# Patient Record
Sex: Female | Born: 1995 | Race: White | Marital: Single | Smoking: Never smoker
Health system: Southern US, Community
[De-identification: ages and names within clinical notes are randomized; demographics above are authoritative.]

## PROBLEM LIST (undated history)

## (undated) DIAGNOSIS — K529 Noninfective gastroenteritis and colitis, unspecified: Secondary | ICD-10-CM

## (undated) HISTORY — PX: COLONOSCOPY: SHX174

## (undated) HISTORY — PX: ESOPHAGOGASTRODUODENOSCOPY ENDOSCOPY: SHX5814

---
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
## 2017-03-08 IMAGING — CR DG CERVICAL SPINE COMPLETE 4+V
5 series · 5 of 5 positions shown · non-contrast
Comparison: None.

EXAM:
CERVICAL SPINE - COMPLETE 4+ VIEW

[c-spine lat]
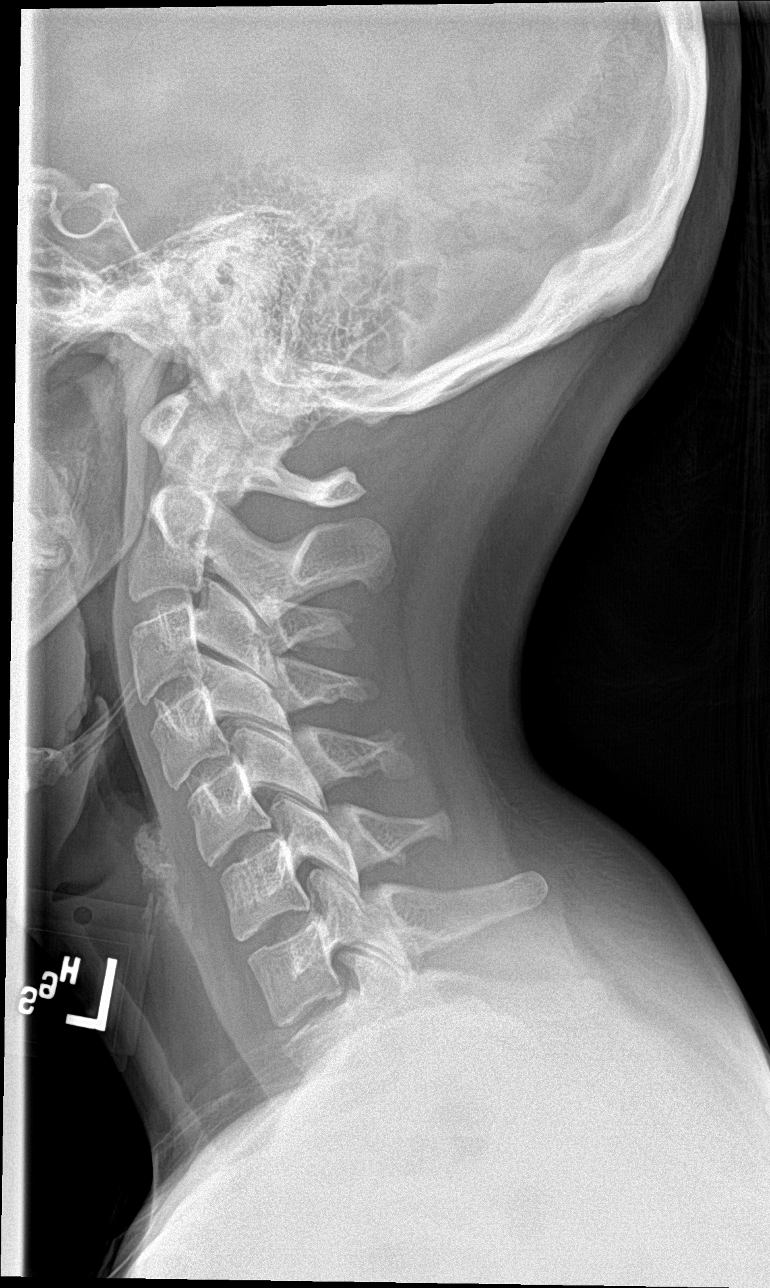

[c-spine obl (1 of 2)]
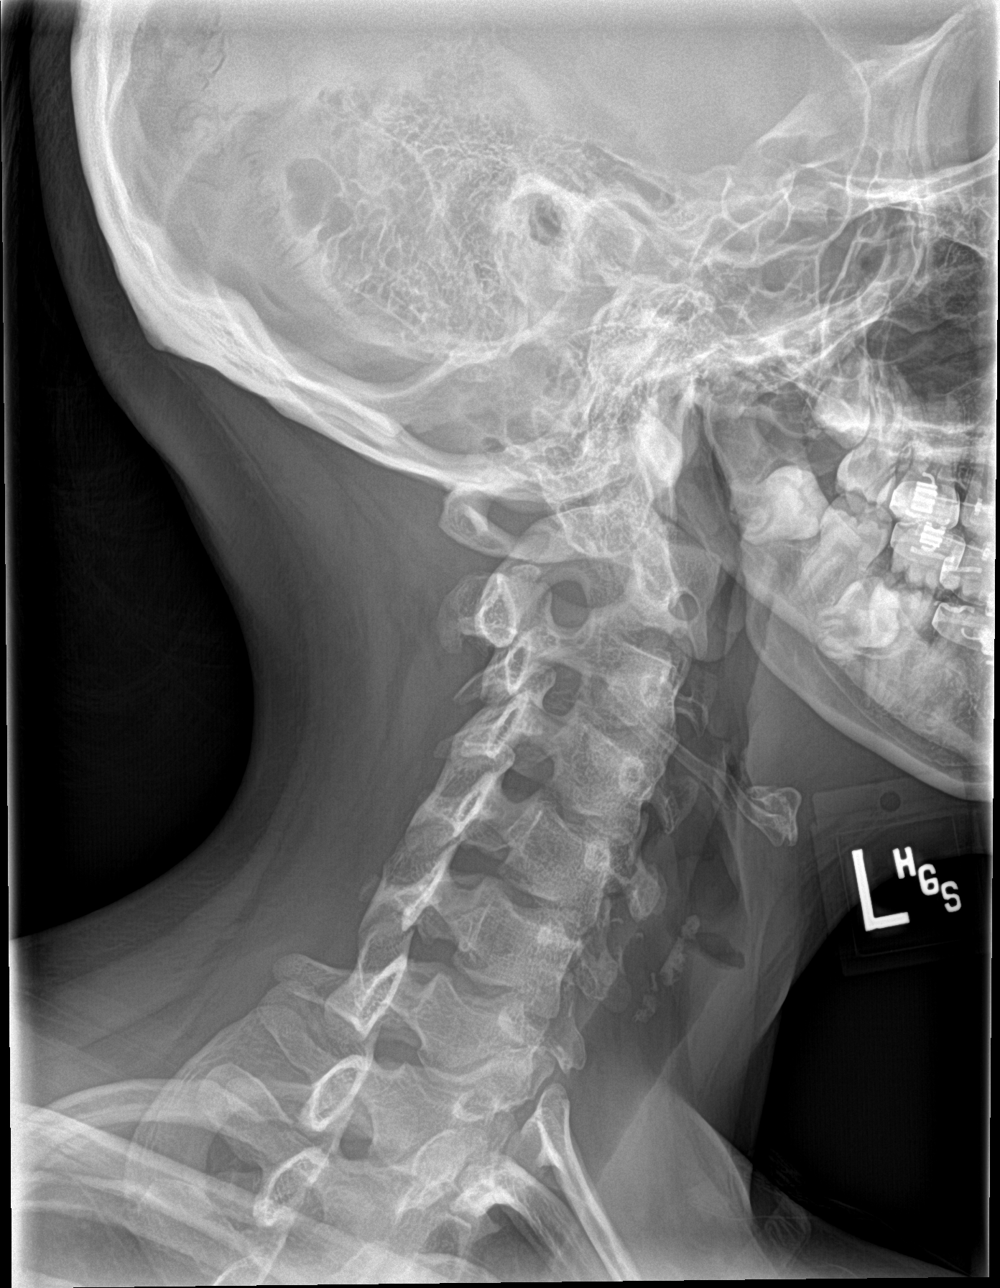

[c-spine obl (2 of 2)]
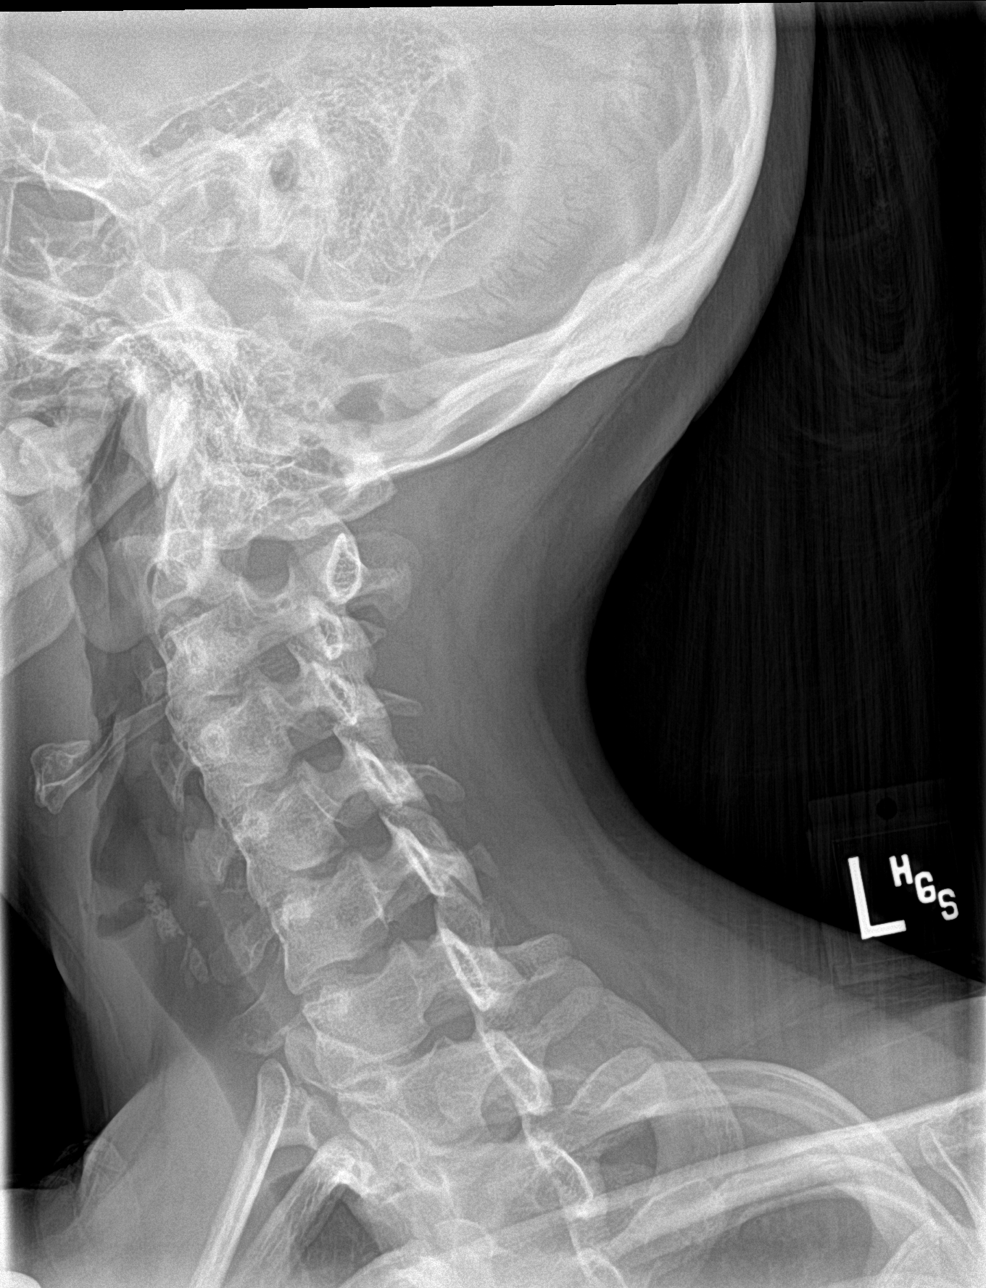

[c-spine ap]
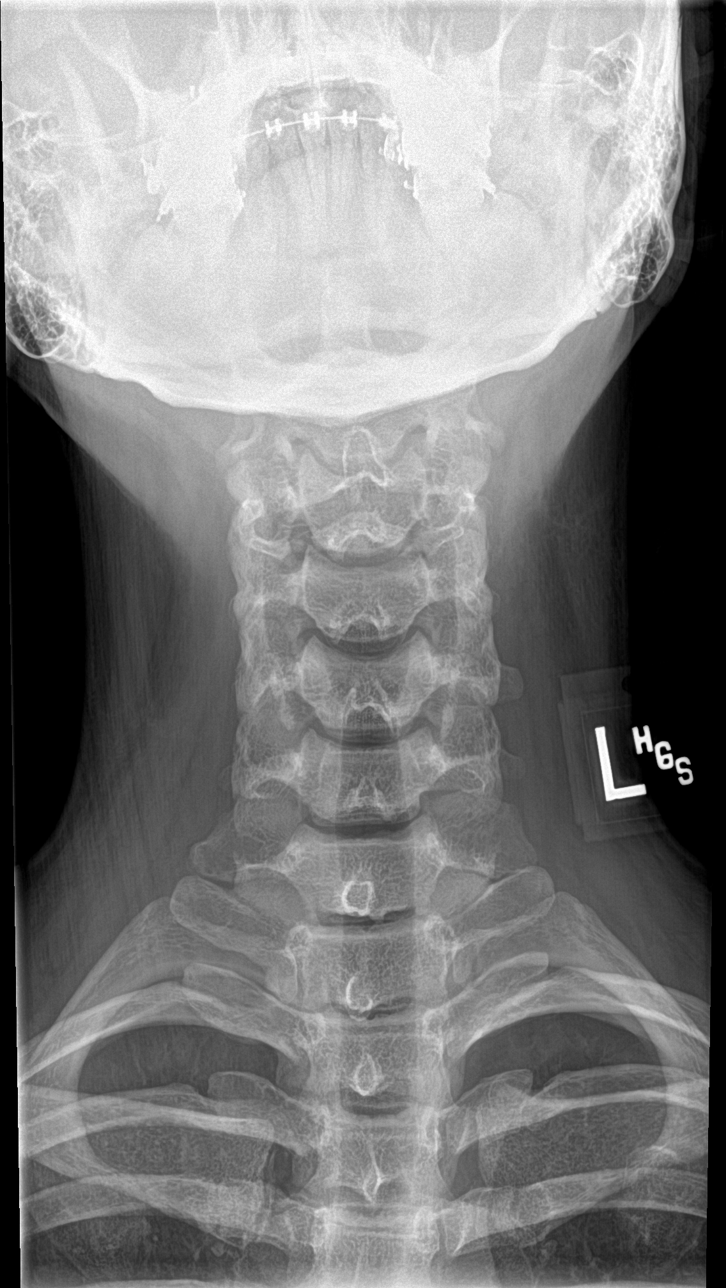

[c-spine open mouth]
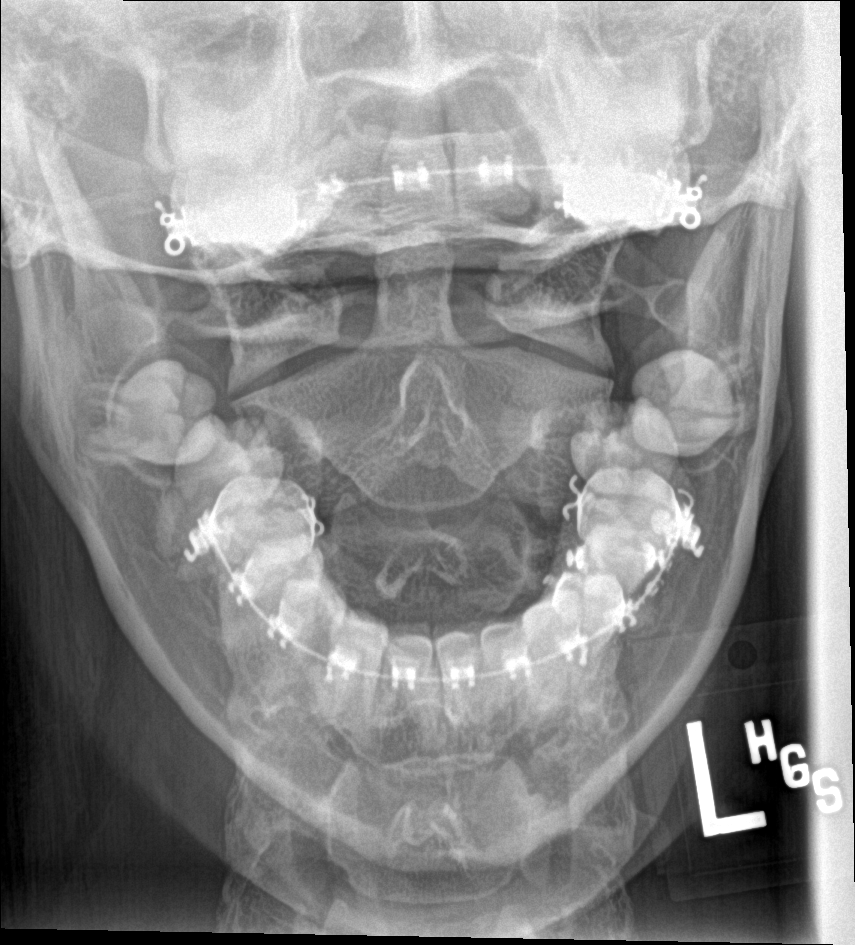

[5 of 5 positions shown; findings below may reference images not displayed]

FINDINGS: There is no evidence of cervical spine fracture or prevertebral soft
tissue swelling. Alignment is normal. No other significant bone
abnormalities are identified.
IMPRESSION: Normal examination.

## 2020-05-10 ENCOUNTER — Ambulatory Visit
Admission: EM | Admit: 2020-05-10 | Discharge: 2020-05-10 | Disposition: A | Discharge status: Home / Self Care | Payer: 59 | Attending: Family Medicine | Admitting: Family Medicine

## 2020-05-10 ENCOUNTER — Other Ambulatory Visit: Payer: Self-pay

## 2020-05-10 DIAGNOSIS — R110 Nausea: Secondary | ICD-10-CM | POA: Diagnosis not present

## 2020-05-10 DIAGNOSIS — R0981 Nasal congestion: Secondary | ICD-10-CM | POA: Insufficient documentation

## 2020-05-10 DIAGNOSIS — Z20822 Contact with and (suspected) exposure to covid-19: Secondary | ICD-10-CM | POA: Diagnosis not present

## 2020-05-10 DIAGNOSIS — Z79899 Other long term (current) drug therapy: Secondary | ICD-10-CM | POA: Insufficient documentation

## 2020-05-10 DIAGNOSIS — M791 Myalgia, unspecified site: Secondary | ICD-10-CM | POA: Insufficient documentation

## 2020-05-10 DIAGNOSIS — R5383 Other fatigue: Secondary | ICD-10-CM

## 2020-05-10 LAB — URINALYSIS, COMPLETE (UACMP) WITH MICROSCOPIC
Bacteria, UA: NONE SEEN
Bilirubin Urine: NEGATIVE
Glucose, UA: NEGATIVE mg/dL
Ketones, ur: NEGATIVE mg/dL
Leukocytes,Ua: NEGATIVE
Nitrite: NEGATIVE
Protein, ur: NEGATIVE mg/dL
RBC / HPF: NONE SEEN RBC/hpf (ref 0–5)
Specific Gravity, Urine: <1.005 — ABNORMAL LOW (ref 1.005–1.030)
WBC, UA: NONE SEEN WBC/hpf (ref 0–5)
pH: 6 (ref 5.0–8.0)

## 2020-05-10 MED ORDER — ONDANSETRON HCL 8 MG PO TABS: 8.0000 mg | ORAL_TABLET | Freq: Three times a day (TID) | ORAL | 0 refills | Status: AC | PRN

## 2020-05-10 MED ORDER — ONDANSETRON 8 MG PO TBDP
8.0000 mg | ORAL_TABLET | Freq: Once | ORAL | Status: AC
  Administered 2020-05-10: 8 mg via ORAL

## 2020-05-10 NOTE — ED Triage Notes (Signed)
Pt reports stomach bothering her, fatigue, muscle pain and congestion after having a "head cold" last week.

## 2020-05-10 NOTE — Discharge Instructions (Addendum)
Stay on bland diet, rest and dont over hydrate. If you get worse and your test is negative for covid, please make sure are seen again.
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
## 2020-05-11 LAB — NOVEL CORONAVIRUS, NAA (HOSP ORDER, SEND-OUT TO REF LAB; TAT 18-24 HRS): SARS-CoV-2, NAA: NOT DETECTED
